# Patient Record
Sex: Male | Born: 2000 | Race: Black or African American | Hispanic: No | Marital: Single | Smoking: Never smoker
Health system: Southern US, Community
[De-identification: ages and names within clinical notes are randomized; demographics above are authoritative.]

---
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
## 2019-09-25 ENCOUNTER — Ambulatory Visit (HOSPITAL_COMMUNITY)
Admission: EM | Admit: 2019-09-25 | Discharge: 2019-09-25 | Disposition: A | Discharge status: Home / Self Care | Payer: Medicaid Other

## 2019-09-25 ENCOUNTER — Emergency Department: Payer: Self-pay

## 2019-09-25 ENCOUNTER — Emergency Department
Admission: EM | Admit: 2019-09-25 | Discharge: 2019-09-25 | Disposition: A | Discharge status: Home / Self Care | Payer: Self-pay | Attending: Emergency Medicine | Admitting: Emergency Medicine

## 2019-09-25 ENCOUNTER — Other Ambulatory Visit: Payer: Self-pay

## 2019-09-25 DIAGNOSIS — S022XXA Fracture of nasal bones, initial encounter for closed fracture: Secondary | ICD-10-CM | POA: Insufficient documentation

## 2019-09-25 DIAGNOSIS — S0285XA Fracture of orbit, unspecified, initial encounter for closed fracture: Secondary | ICD-10-CM | POA: Insufficient documentation

## 2019-09-25 DIAGNOSIS — Y999 Unspecified external cause status: Secondary | ICD-10-CM | POA: Insufficient documentation

## 2019-09-25 DIAGNOSIS — Y929 Unspecified place or not applicable: Secondary | ICD-10-CM | POA: Insufficient documentation

## 2019-09-25 DIAGNOSIS — Y939 Activity, unspecified: Secondary | ICD-10-CM | POA: Insufficient documentation

## 2019-09-25 MED ORDER — IBUPROFEN 600 MG PO TABS: 600.0000 mg | ORAL_TABLET | Freq: Three times a day (TID) | ORAL | 0 refills | Status: AC | PRN

## 2019-09-25 MED ORDER — IBUPROFEN 600 MG PO TABS
600.0000 mg | ORAL_TABLET | Freq: Once | ORAL | Status: AC
  Administered 2019-09-25: 600 mg via ORAL
  Filled 2019-09-25: qty 1

## 2019-09-25 MED ORDER — TRAMADOL HCL 50 MG PO TABS
50.0000 mg | ORAL_TABLET | Freq: Once | ORAL | Status: AC
  Administered 2019-09-25: 50 mg via ORAL
  Filled 2019-09-25: qty 1

## 2019-09-25 MED ORDER — TRAMADOL HCL 50 MG PO TABS: 50.0000 mg | ORAL_TABLET | Freq: Four times a day (QID) | ORAL | 0 refills | Status: AC | PRN

## 2019-09-25 NOTE — ED Notes (Signed)
Pt with right cheek swelling. Right eye red, pt states eye is sensitive to light. Right pupil 28mm not quite round, left pupil 2 mm round. Provider notified and to room.
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
## 2019-09-25 NOTE — Discharge Instructions (Addendum)
Call the number highlighted on your discharge care instruction.  Tell them you a follow-up from the emergency room visit today and he was told to call for an appointment for Wednesday of this week.

## 2021-03-30 IMAGING — CT CT HEAD W/O CM
3 series · 15 of 47 positions shown, 18 images · non-contrast
Comparison: None.

CLINICAL DATA: Posttraumatic headache

EXAM:
CT HEAD WITHOUT CONTRAST
TECHNIQUE: Contiguous axial images were obtained from the base of the skull
through the vertex without intravenous contrast.

[Series 2: head wo · axial · 0.43mm/px · z∈[-150,-25]mm · 9 of 30 slices shown, 12 images]
[im 3/30  brain]
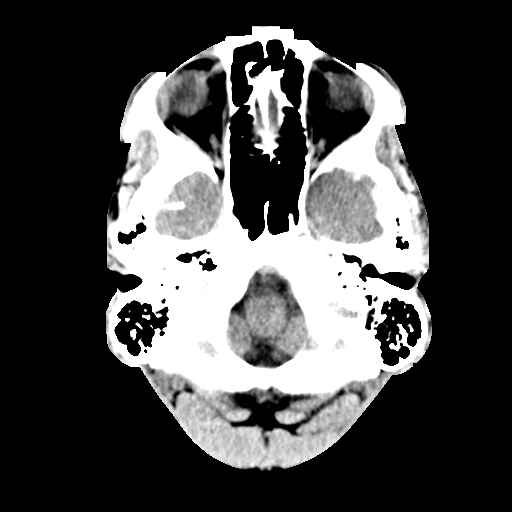
[im 3/30  bone]
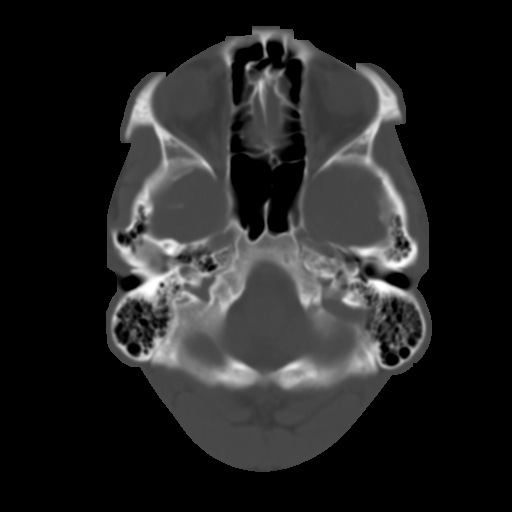
[im 6/30  brain]
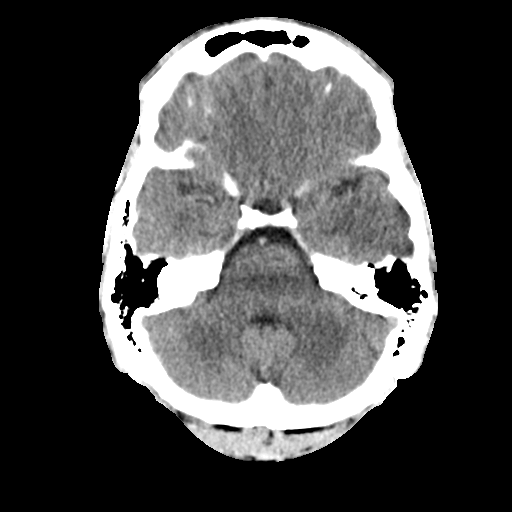
[im 9/30  brain]
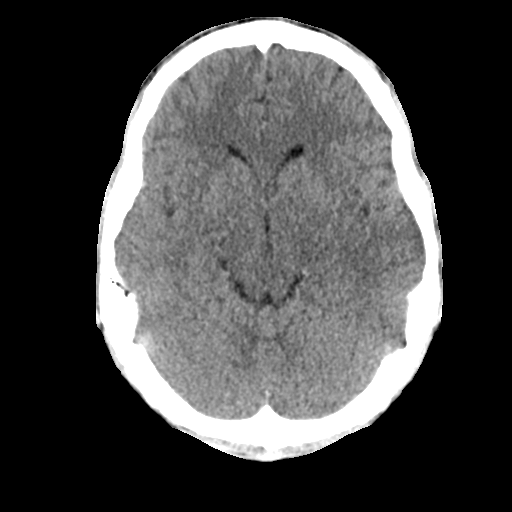
[im 12/30  brain]
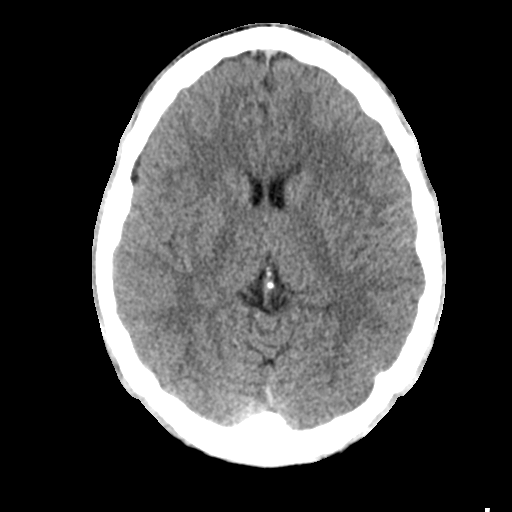
[im 16/30  brain]
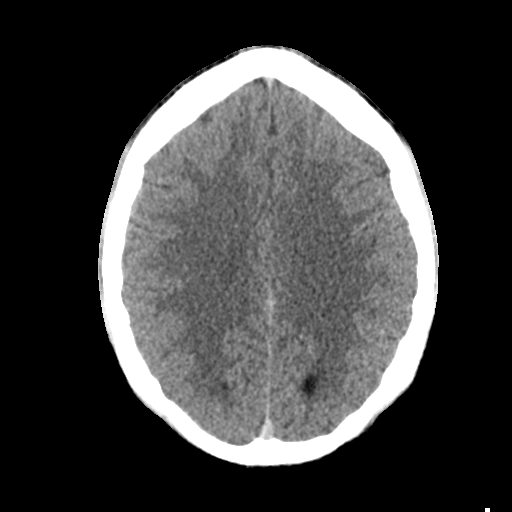
[im 16/30  bone]
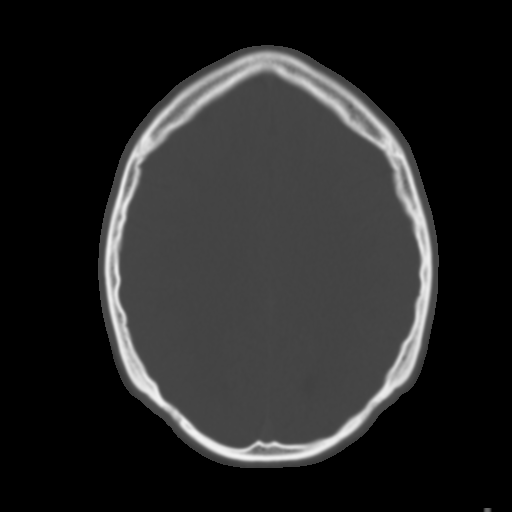
[im 19/30  brain]
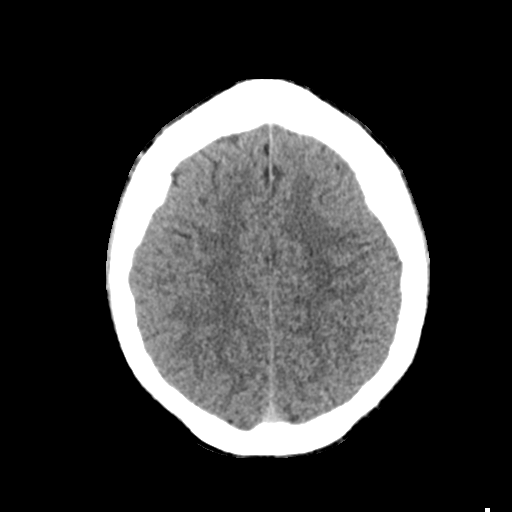
[im 22/30  brain]
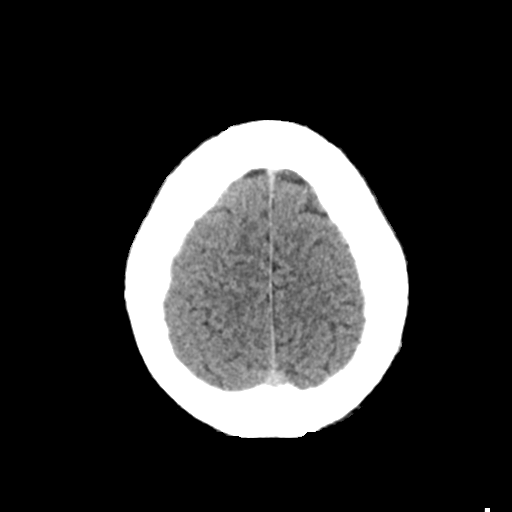
[im 25/30  brain]
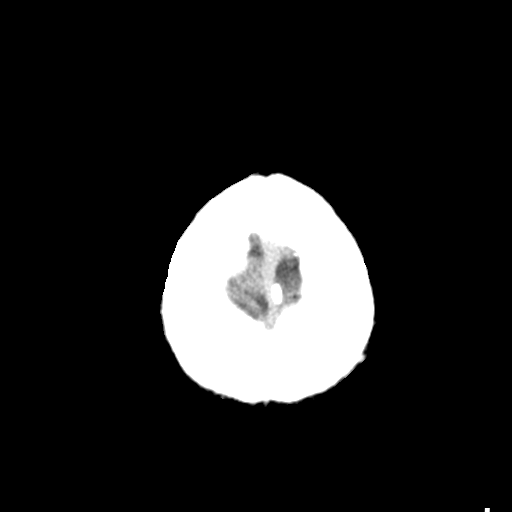
[im 28/30  brain]
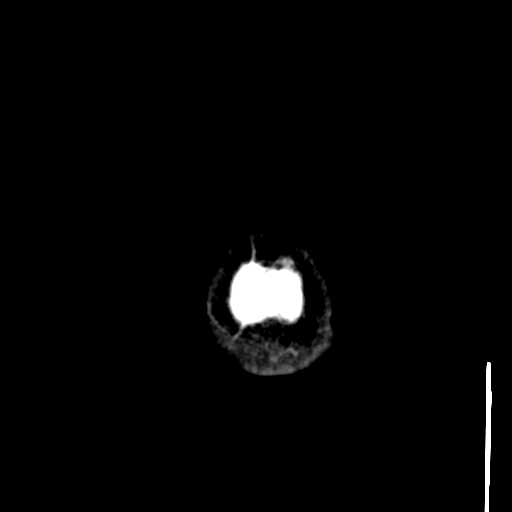
[im 28/30  bone]
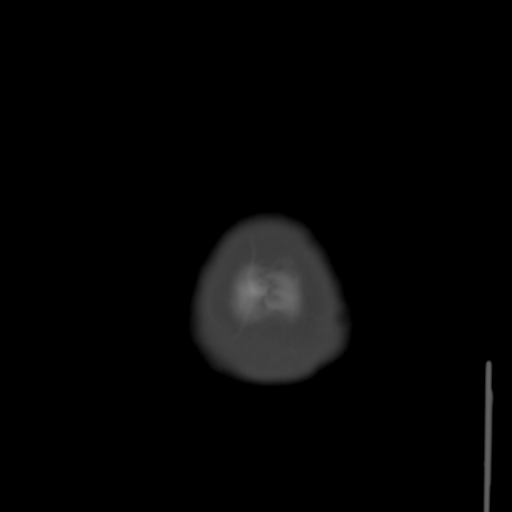

[Series 4: coronal soft tissue · coronal · 0.30mm/px · 3 of 68 slices shown]
[im 23/68  brain]
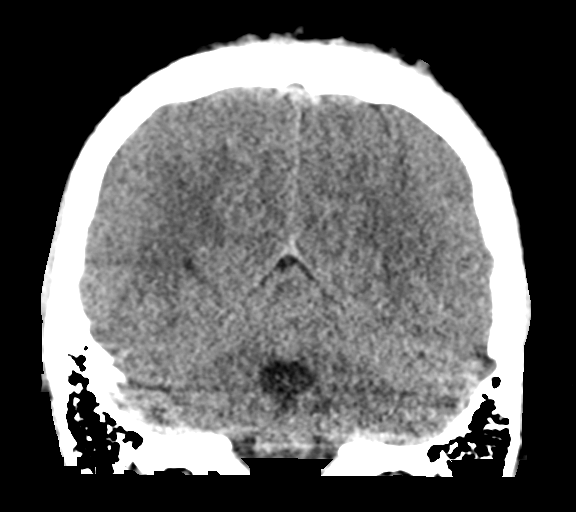
[im 30/68  brain]
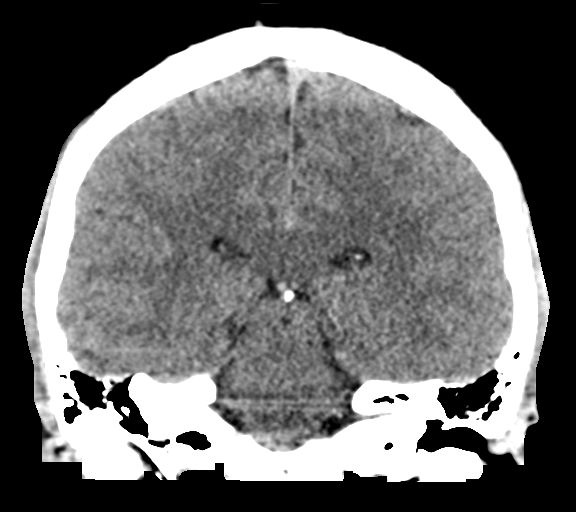
[im 38/68  brain]
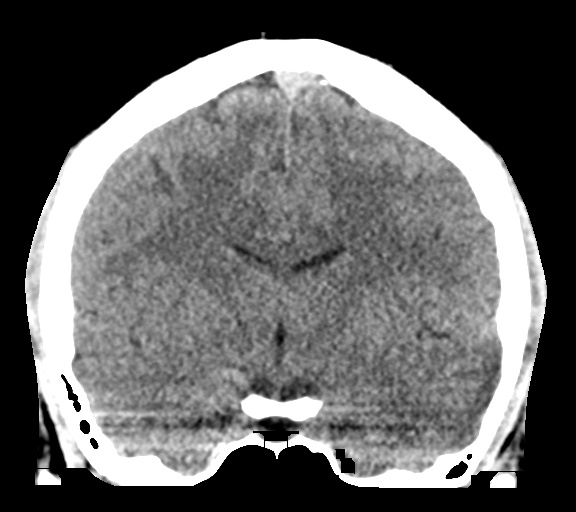

[Series 5: sagittal soft tissue · sagittal · 0.30mm/px · 3 of 58 slices shown]
[im 20/58  brain]
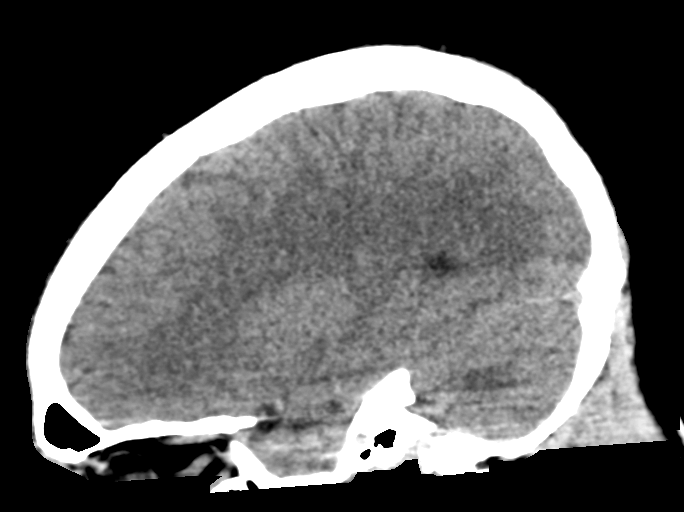
[im 29/58  brain]
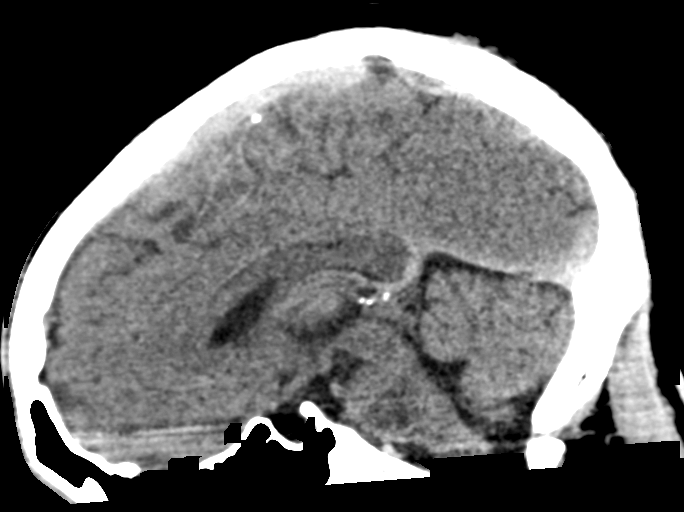
[im 39/58  brain]
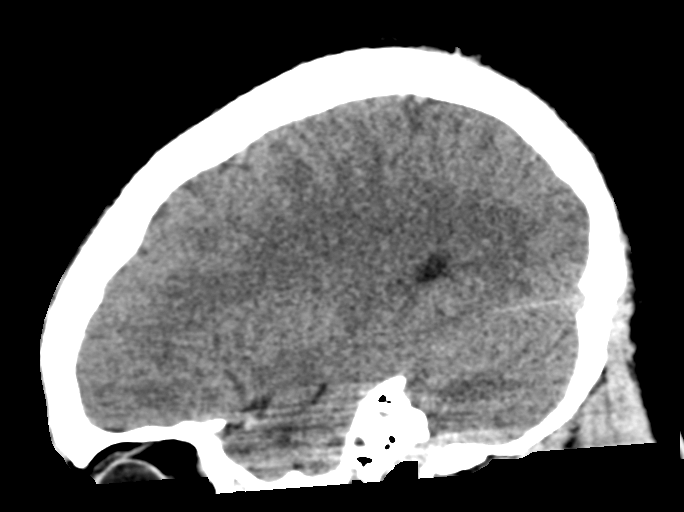

[15 of 47 positions shown; findings below may reference images not displayed]

FINDINGS: Brain: There is no acute intracranial hemorrhage, mass-effect, or
edema. Gray-white differentiation is preserved. There is no
extra-axial fluid collection. Ventricles and sulci are within normal
limits in size and configuration.

Vascular: No hyperdense vessel or unexpected calcification.

Skull: Calvarium is intact.

Visualized sinuses/Orbits: No acute finding.

Other: None.
IMPRESSION: No evidence of acute intracranial injury.
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
